# Patient Record
Sex: Female | Born: 1990 | Race: Black or African American | Hispanic: No | Marital: Single | Smoking: Never smoker
Health system: Southern US, Community
[De-identification: ages and names within clinical notes are randomized; demographics above are authoritative.]

---

## 2020-05-15 ENCOUNTER — Other Ambulatory Visit: Payer: Self-pay

## 2020-05-15 ENCOUNTER — Encounter: Payer: Self-pay | Admitting: Emergency Medicine

## 2020-05-15 ENCOUNTER — Emergency Department
Admission: EM | Admit: 2020-05-15 | Discharge: 2020-05-15 | Disposition: A | Discharge status: Home / Self Care | Payer: Medicaid Other | Attending: Emergency Medicine | Admitting: Emergency Medicine

## 2020-05-15 ENCOUNTER — Emergency Department: Payer: Medicaid Other

## 2020-05-15 DIAGNOSIS — Q0700 Arnold-Chiari syndrome without spina bifida or hydrocephalus: Secondary | ICD-10-CM | POA: Insufficient documentation

## 2020-05-15 DIAGNOSIS — S0990XA Unspecified injury of head, initial encounter: Secondary | ICD-10-CM

## 2020-05-15 DIAGNOSIS — S0181XA Laceration without foreign body of other part of head, initial encounter: Secondary | ICD-10-CM | POA: Diagnosis not present

## 2020-05-15 DIAGNOSIS — X58XXXA Exposure to other specified factors, initial encounter: Secondary | ICD-10-CM | POA: Insufficient documentation

## 2020-05-15 DIAGNOSIS — H05231 Hemorrhage of right orbit: Secondary | ICD-10-CM

## 2020-05-15 DIAGNOSIS — G935 Compression of brain: Secondary | ICD-10-CM

## 2020-05-15 MED ORDER — LIDOCAINE-EPINEPHRINE (PF) 2 %-1:200000 IJ SOLN
10.0000 mL | Freq: Once | INTRAMUSCULAR | Status: AC
  Administered 2020-05-15: 10 mL via INTRADERMAL
  Filled 2020-05-15: qty 10

## 2020-05-15 NOTE — ED Triage Notes (Addendum)
Patient brought in by [employer] officer. Patient with a laceration to her forehead and swelling to right eye. Patient states that she does not know what happened. Patient denies pain anywhere else.

## 2020-05-15 NOTE — ED Notes (Signed)
Patient declined final set of vitals 

## 2020-05-15 NOTE — ED Notes (Signed)
Dried blood cleansed from patients face and laceration cleansed.

## 2020-05-15 NOTE — ED Notes (Addendum)
Patient unable to tolerate numbing. MD applied dermabond and steristrips.

## 2020-05-15 NOTE — ED Provider Notes (Signed)
[facility] Emergency Department Provider Note   ____________________________________________   Event Date/Time   First MD Initiated Contact with Patient 05/15/20 0400     (approximate)  I have reviewed the triage vital signs and the nursing notes.   HISTORY  Chief Complaint Head Injury    HPI Alexandra Gallagher is a 29 y.o. female brought to the ED by [employer] with facial injury.  Patient declines to say what happened.  Presents with laceration to forehead and swelling to right eye.  Tetanus is up-to-date.  Voices no other complaints or injuries.  Denies vision changes, neck pain, chest pain, shortness of breath, abdominal pain, nausea, vomiting or dizziness.     Past medical history None  There are no problems to display for this patient.   History reviewed. No pertinent surgical history.  Prior to Admission medications   Not on File    Allergies Penicillins  No family history on file.  Social History Social History   Tobacco Use  . Smoking status: Never Smoker  . Smokeless tobacco: Never Used  Substance Use Topics  . Alcohol use: Not Currently  . Drug use: Not Currently    Review of Systems  Constitutional: No fever/chills Eyes: Positive for right eye swelling.  No visual changes. ENT: No sore throat. Cardiovascular: Denies chest pain. Respiratory: Denies shortness of breath. Gastrointestinal: No abdominal pain.  No nausea, no vomiting.  No diarrhea.  No constipation. Genitourinary: Negative for dysuria. Musculoskeletal: Negative for back pain. Skin: Positive for laceration to forehead.  Negative for rash. Neurological: Negative for headaches, focal weakness or numbness.   ____________________________________________   PHYSICAL EXAM:  VITAL SIGNS: ED Triage Vitals [05/15/20 0334]  Enc Vitals Group     BP 120/72     Pulse Rate (!) 112     Resp 18     Temp 99.4 F (37.4 C)     Temp Source Oral     SpO2 99 %     Weight  130 lb (59 kg)     Height 4\' 11"  (1.499 m)     Head Circumference      Peak Flow      Pain Score 0     Pain Loc      Pain Edu?      Excl. in GC?     Constitutional: Alert and oriented. Well appearing and in no acute distress. Eyes: Conjunctivae are normal. PERRL. EOMI. Mild right periorbital hematoma.  No hyphema.  No globe rupture. Head: Approximately 3 cm vertically linear left forehead laceration with no active bleeding. Nose: Atraumatic. Mouth/Throat: Mucous membranes are moist.  No dental malocclusion. Neck: No stridor.  No cervical spine tenderness to palpation. Cardiovascular: Normal rate, regular rhythm. Grossly normal heart sounds.  Good peripheral circulation. Respiratory: Normal respiratory effort.  No retractions. Lungs CTAB. Gastrointestinal: Soft and nontender to light or deep palpation. No distention. No abdominal bruits. No CVA tenderness. Musculoskeletal: No lower extremity tenderness nor edema.  No joint effusions. Neurologic: Alert and oriented x3.  CN II-XII grossly intact.  Normal speech and language. No gross focal neurologic deficits are appreciated. No gait instability. Skin:  Skin is warm, dry and intact. No rash noted. Psychiatric: Mood and affect are normal. Speech and behavior are normal.  ____________________________________________   LABS (all labs ordered are listed, but only abnormal results are displayed)  Labs Reviewed - No data to display ____________________________________________  EKG  None ____________________________________________  RADIOLOGY I, Soma Lizak J, personally viewed and  evaluated these images (plain radiographs) as part of my medical decision making, as well as reviewing the written report by the radiologist.  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 EXAM: CT HEAD WITHOUT CONTRAST TECHNIQUE: Contiguous axial images were obtained from the base of the skull through the vertex without intravenous contrast. COMPARISON:  None. FINDINGS: Brain: Cerebral volume within normal limits for patient age. No evidence for acute intracranial hemorrhage. No findings to suggest acute large vessel territory infarct. No mass lesion, midline shift, or mass effect. Ventricles are normal in size without evidence for hydrocephalus. No extra-axial fluid collection identified. Probable Chiari 1 malformation noted. Vascular: No hyperdense vessel identified. Skull: Soft tissue laceration present at the left forehead. Calvarium intact. Sinuses/Orbits: Globes and orbital soft tissues within normal limits. Visualized paranasal sinuses are clear. No mastoid effusion. IMPRESSION: 1. No acute intracranial abnormality. 2. Soft tissue laceration at the left forehead. No calvarial fracture. 3. 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 Electronically Signed   By: Rise Mu M.D.   On: 05/15/2020 04:56   CT Maxillofacial Wo Contrast  Result Date: 05/15/2020 CLINICAL DATA:  Initial evaluation for acute facial trauma. EXAM: CT MAXILLOFACIAL WITHOUT CONTRAST TECHNIQUE: Multidetector CT imaging of the maxillofacial structures was performed. Multiplanar CT image reconstructions were also generated. COMPARISON:  None. FINDINGS: Osseous: Zygomatic arches intact. No acute maxillary fracture. Pterygoid plates intact. Age-indeterminate fracture of the right nasal bone (series 3, image 39). Nasal septum intact. No acute mandibular fracture. Mandibular condyles normally situated. No acute abnormality about the dentition. Orbits: Globes and orbital soft tissues within normal limits. Bony orbits intact. Sinuses: Paranasal sinuses are clear. Soft tissues: Soft tissue laceration at the left forehead. Right periorbital and facial contusion. Limited intracranial: Unremarkable.  IMPRESSION: 1. Age-indeterminate fracture of the right nasal bone. Correlation with physical exam recommended. 2. Soft tissue laceration at the left forehead with right periorbital and facial contusion. 3. No other acute maxillofacial injury. Intact globes with no retro-orbital pathology. Electronically Signed   By: Rise Mu M.D.   On: 05/15/2020 05:00   CT cervical spine interpreted per Dr. Phill Myron: 1. No acute traumatic injury within the cervical spine. 2. Smooth reversal of the normal cervical lordosis, which may be related to positioning and/or muscular spasm. ____________________________________________   PROCEDURES  Procedure(s) performed (including Critical Care):  Marland KitchenMarland KitchenLaceration Repair  Date/Time: 05/15/2020 5:11 AM Performed by: Irean Hong, MD Authorized by: Irean Hong, MD   Consent:    Consent obtained:  Verbal   Consent given by:  Patient   Risks discussed:  Infection, pain, poor cosmetic result and poor wound healing Anesthesia:    Anesthesia method:  Local infiltration   Local anesthetic:  Lidocaine 1% WITH epi Laceration details:    Location:  Face   Face location:  Forehead Pre-procedure details:    Preparation:  Patient was prepped and draped in usual sterile fashion Exploration:    Hemostasis achieved with:  Direct pressure   Contaminated: no   Treatment:    Area cleansed with:  Saline   Amount of cleaning:  Standard   Irrigation solution:  Sterile saline   Irrigation method:  Tap   Visualized foreign bodies/material removed: no   Skin repair:    Repair method:  Steri-Strips and tissue adhesive   Number of Steri-Strips:  2 Approximation:  Approximation:  Close Repair type:    Repair type:  Simple Post-procedure details:    Procedure completion:  Tolerated well, no immediate complications Comments:     Patient initially did not tolerate local infiltration of anesthetic and ultimately Dermabond/Steri-Strips were  applied     ____________________________________________   INITIAL IMPRESSION / ASSESSMENT AND PLAN / ED COURSE  As part of my medical decision making, I reviewed the following data within the electronic MEDICAL RECORD NUMBER Nursing notes reviewed and incorporated, Radiograph reviewed and Notes from prior ED visits     29 year old female presenting with forehead laceration and right periorbital hematoma.  Differential diagnosis includes but is not limited to ICH, cervical spine injury, facial fracture, etc.  We will obtain CT head/cervical spine/maxillofacial.  Will suture facial laceration.  Clinical Course as of 05/15/20 0602  Wynelle Link May 15, 2020  0507 Patient did not tolerate numbing for sutures.  Did tell her sutures would be the best cosmetic option.  Also offered Dermabond or derma clips.  Ultimately laceration was Dermabonded and Steri-Strips applied.  Awaiting results of CT scans. [JS]  0601 Patient was updated of CT scan results and encourage neurosurgery follow-up for possible Chiari I malformation.  Strict return precautions were given.  Patient verbalized understanding and agreed with plan of care. [JS]    Clinical Course User Index [JS] Irean Hong, MD     ____________________________________________   FINAL CLINICAL IMPRESSION(S) / ED DIAGNOSES  Final diagnoses:  Injury of head, initial encounter  Facial laceration, initial encounter  Chiari I malformation (HCC)  Periorbital hematoma of right eye     ED Discharge Orders    None      *Please note:  Alexandra Gallagher was evaluated in Emergency Department on 05/15/2020 for the symptoms described in the history of present illness. She was evaluated in the context of the global COVID-19 pandemic, which necessitated consideration that the patient might be at risk for infection with the SARS-CoV-2 virus that causes COVID-19. Institutional protocols and algorithms that pertain to the evaluation of patients at risk for  COVID-19 are in a state of rapid change based on information released by regulatory bodies including the CDC and federal and state organizations. These policies and algorithms were followed during the patient's care in the ED.  Some ED evaluations and interventions may be delayed as a result of limited staffing during and the pandemic.*   Note:  This document was prepared using Dragon voice recognition software and may include unintentional dictation errors.   Irean Hong, MD 05/15/20 [phone]

## 2020-05-15 NOTE — Discharge Instructions (Addendum)
Apply ice to right eye several times daily to reduce swelling.  Medical tape should fall off in about 5 to 7 days.  You may peel off after this time if tape is still adhered.  Return to the ER for worsening symptoms, persistent vomiting, lethargy or other concerns.

## 2020-05-15 NOTE — ED Notes (Signed)
Patient transported to CT 

## 2020-05-15 NOTE — ED Notes (Signed)
Patient returned from CT

## 2022-04-27 IMAGING — CT CT HEAD W/O CM
3 series · 15 of 44 positions shown, 18 images · non-contrast
Comparison: None.

CLINICAL DATA: Initial evaluation for acute facial trauma.

EXAM:
CT HEAD WITHOUT CONTRAST
TECHNIQUE: Contiguous axial images were obtained from the base of the skull
through the vertex without intravenous contrast.

[Series 3: head wo · axial · 0.38mm/px · z∈[-100,+10]mm · 9 of 27 slices shown, 12 images]
[im 3/27  brain]
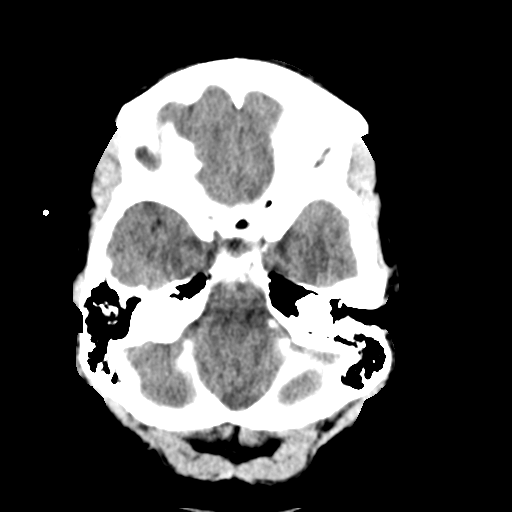
[im 3/27  bone]
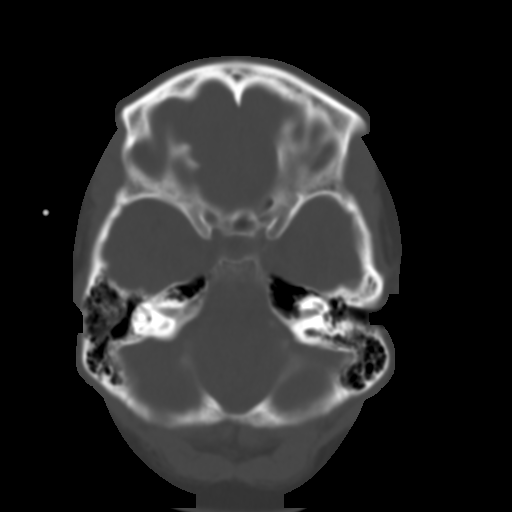
[im 6/27  brain]
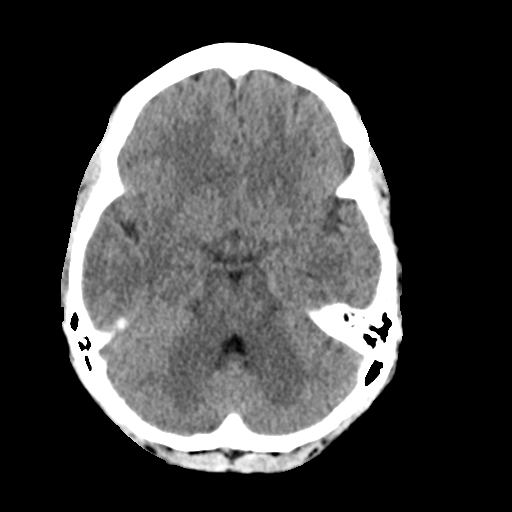
[im 8/27  brain]
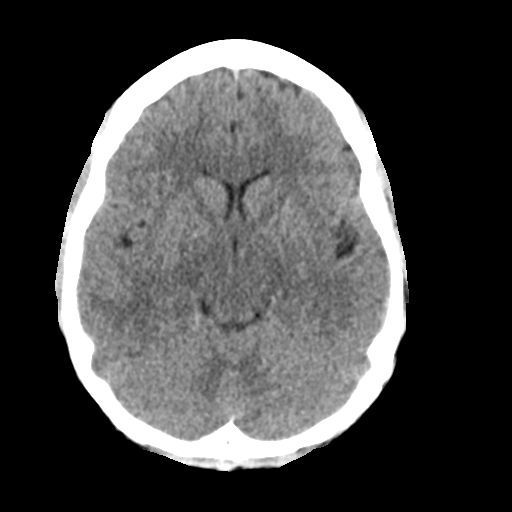
[im 11/27  brain]
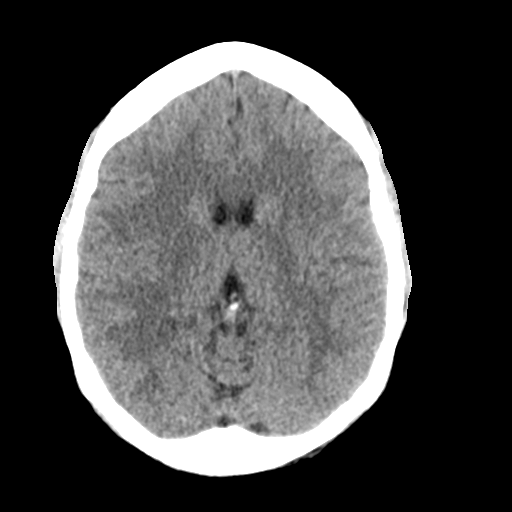
[im 14/27  brain]
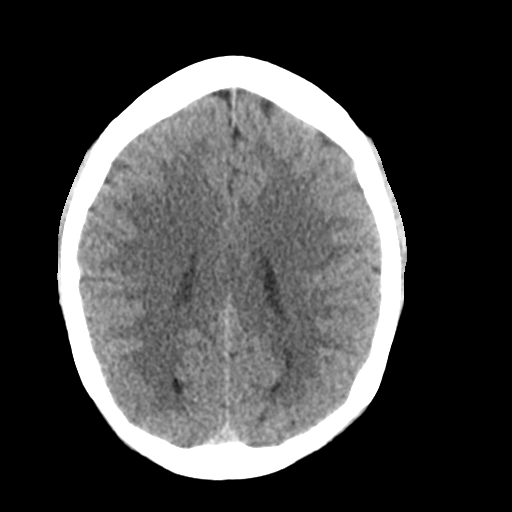
[im 14/27  bone]
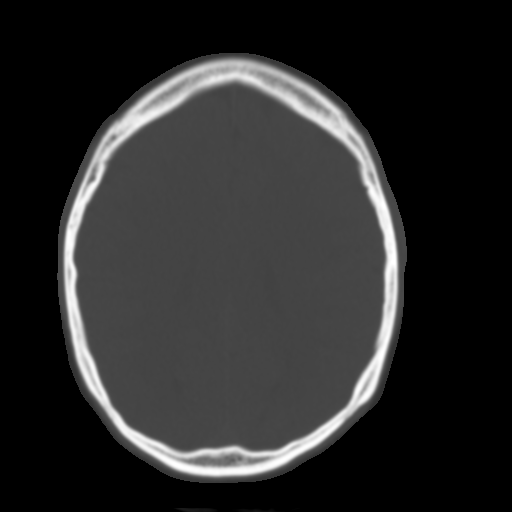
[im 17/27  brain]
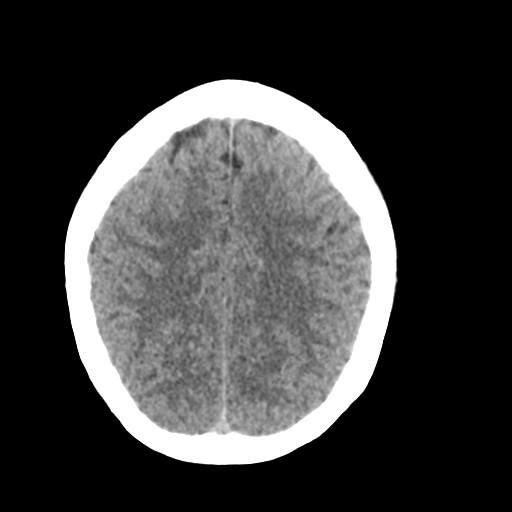
[im 20/27  brain]
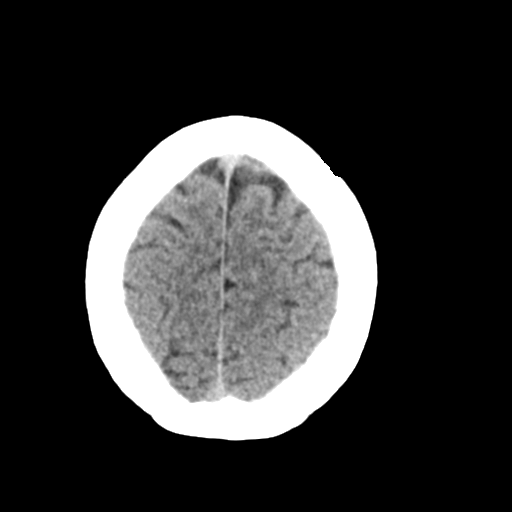
[im 22/27  brain]
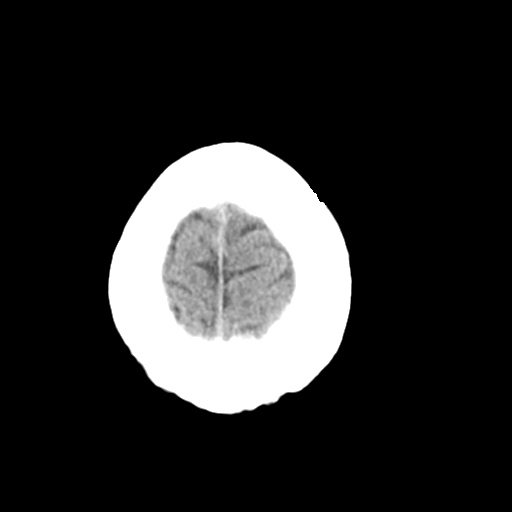
[im 25/27  brain]
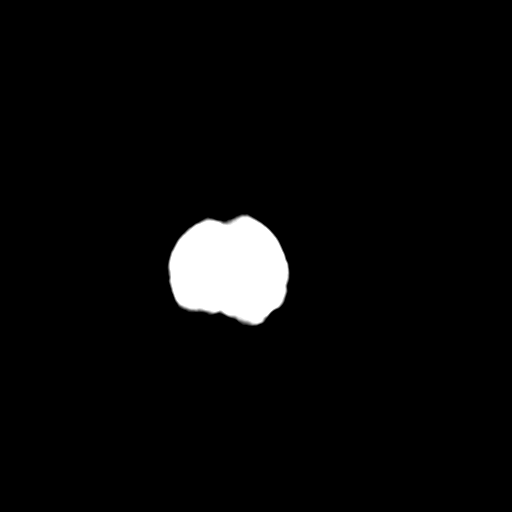
[im 25/27  bone]
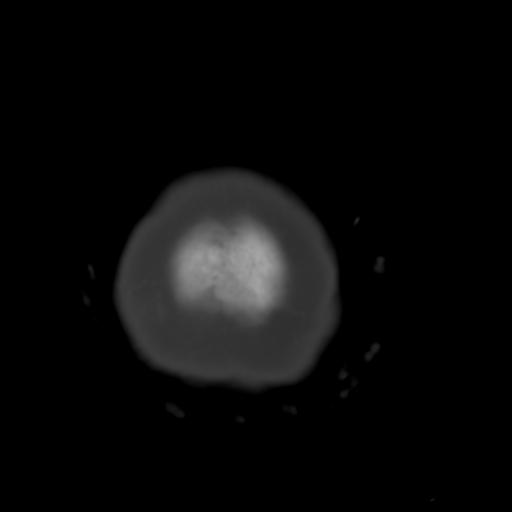

[Series 4: coronal soft tissue · coronal · 0.25mm/px · 3 of 61 slices shown]
[im 21/61  brain]
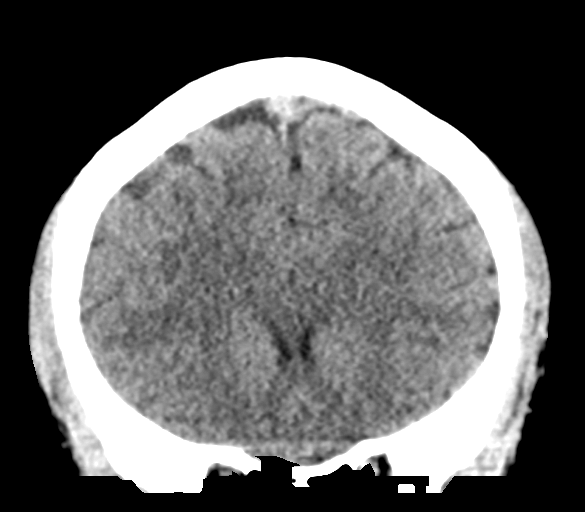
[im 27/61  brain]
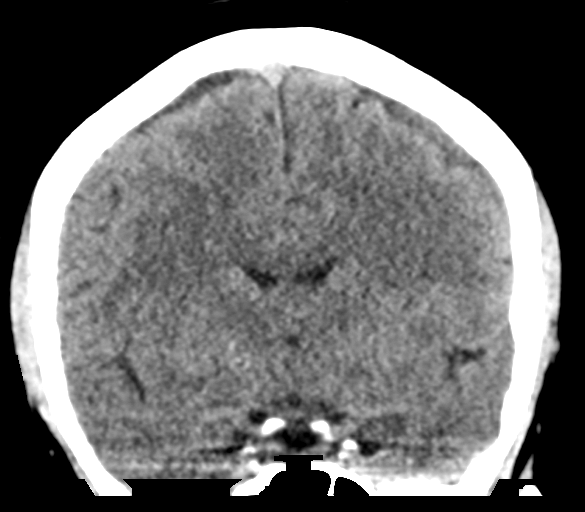
[im 34/61  brain]
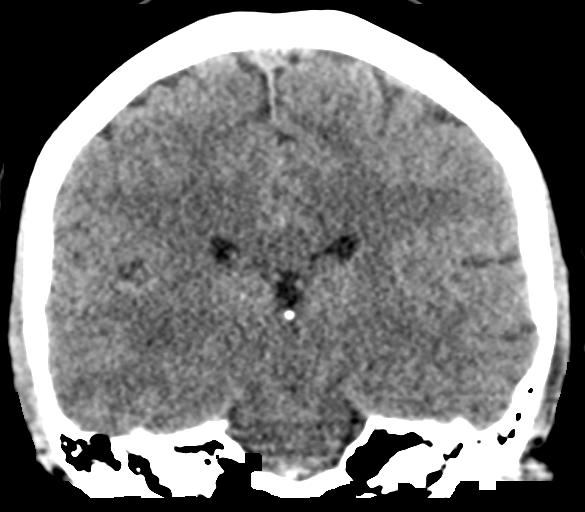

[Series 5: sagittal soft tissue · sagittal · 0.25mm/px · 3 of 49 slices shown]
[im 17/49  brain]
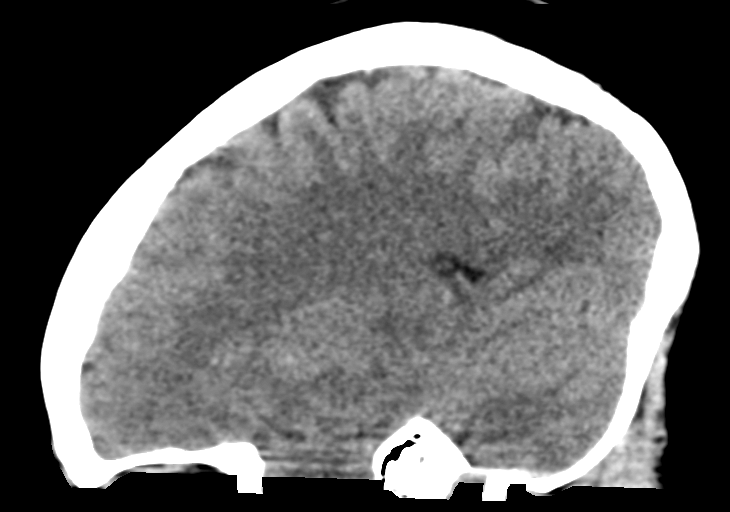
[im 25/49  brain]
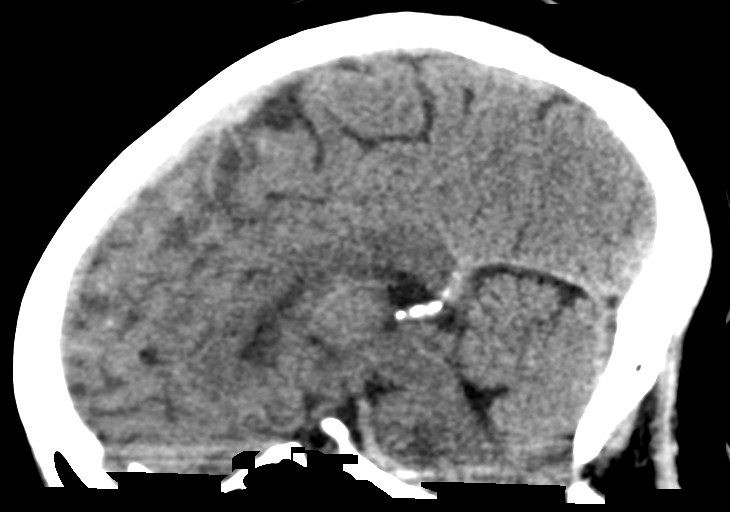
[im 33/49  brain]
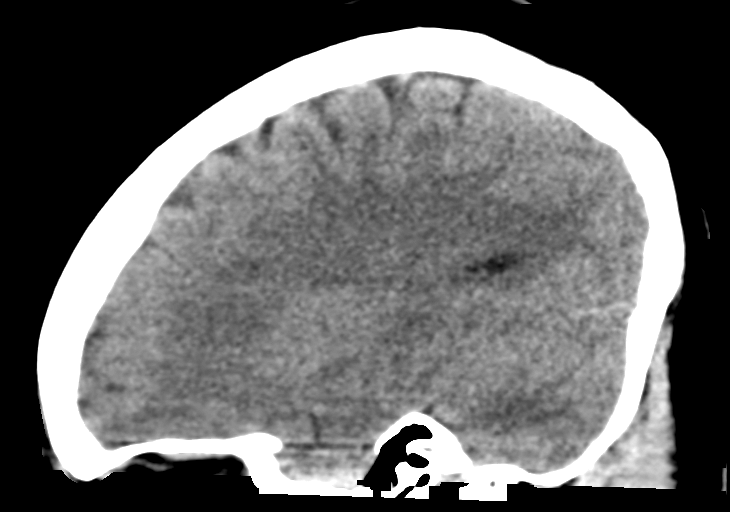

[15 of 44 positions shown; findings below may reference images not displayed]

FINDINGS: Brain: Cerebral volume within normal limits for patient age.

No evidence for acute intracranial hemorrhage. No findings to
suggest acute large vessel territory infarct. No mass lesion,
midline shift, or mass effect. Ventricles are normal in size without
evidence for hydrocephalus. No extra-axial fluid collection
identified. Probable Chiari 1 malformation noted.

Vascular: No hyperdense vessel identified.

Skull: Soft tissue laceration present at the left forehead.
Calvarium intact.

Sinuses/Orbits: Globes and orbital soft tissues within normal
limits.

Visualized paranasal sinuses are clear. No mastoid effusion.
IMPRESSION: 1. No acute intracranial abnormality.
2. Soft tissue laceration at the left forehead. No calvarial
fracture.
3. Probable Chiari 1 malformation.

## 2023-07-19 ENCOUNTER — Ambulatory Visit: Payer: Medicaid Other | Admitting: Family Medicine
